# Patient Record
Sex: Male | Born: 1985 | Race: Black or African American | Hispanic: No | Marital: Single | Smoking: Never smoker
Health system: Southern US, Community
[De-identification: ages and names within clinical notes are randomized; demographics above are authoritative.]

---

## 2015-02-23 ENCOUNTER — Emergency Department (HOSPITAL_BASED_OUTPATIENT_CLINIC_OR_DEPARTMENT_OTHER)
Admission: EM | Admit: 2015-02-23 | Discharge: 2015-02-23 | Disposition: A | Discharge status: Home / Self Care | Payer: Self-pay | Attending: Emergency Medicine | Admitting: Emergency Medicine

## 2015-02-23 ENCOUNTER — Encounter (HOSPITAL_BASED_OUTPATIENT_CLINIC_OR_DEPARTMENT_OTHER): Payer: Self-pay | Admitting: *Deleted

## 2015-02-23 ENCOUNTER — Emergency Department (HOSPITAL_BASED_OUTPATIENT_CLINIC_OR_DEPARTMENT_OTHER): Payer: Self-pay

## 2015-02-23 DIAGNOSIS — S43102A Unspecified dislocation of left acromioclavicular joint, initial encounter: Secondary | ICD-10-CM | POA: Insufficient documentation

## 2015-02-23 DIAGNOSIS — S42032A Displaced fracture of lateral end of left clavicle, initial encounter for closed fracture: Secondary | ICD-10-CM | POA: Insufficient documentation

## 2015-02-23 DIAGNOSIS — S43005A Unspecified dislocation of left shoulder joint, initial encounter: Secondary | ICD-10-CM

## 2015-02-23 DIAGNOSIS — Y998 Other external cause status: Secondary | ICD-10-CM | POA: Insufficient documentation

## 2015-02-23 DIAGNOSIS — S42002A Fracture of unspecified part of left clavicle, initial encounter for closed fracture: Secondary | ICD-10-CM

## 2015-02-23 DIAGNOSIS — Y92321 Football field as the place of occurrence of the external cause: Secondary | ICD-10-CM | POA: Insufficient documentation

## 2015-02-23 DIAGNOSIS — Y9361 Activity, american tackle football: Secondary | ICD-10-CM | POA: Insufficient documentation

## 2015-02-23 DIAGNOSIS — W1839XA Other fall on same level, initial encounter: Secondary | ICD-10-CM | POA: Insufficient documentation

## 2015-02-23 MED ORDER — OXYCODONE-ACETAMINOPHEN 5-325 MG PO TABS: 1.0000 | ORAL_TABLET | Freq: Four times a day (QID) | ORAL | Status: AC | PRN

## 2015-02-23 MED ORDER — IBUPROFEN 600 MG PO TABS: 600.0000 mg | ORAL_TABLET | Freq: Four times a day (QID) | ORAL | Status: AC | PRN

## 2015-02-23 MED ORDER — OXYCODONE-ACETAMINOPHEN 5-325 MG PO TABS
1.0000 | ORAL_TABLET | Freq: Once | ORAL | Status: AC
  Administered 2015-02-23: 1 via ORAL
  Filled 2015-02-23: qty 1

## 2015-02-23 NOTE — ED Notes (Signed)
States he broke left scapula on January 18 States he had a sling x 1.5 months. States he was playing football today and rolled onto left shoulder and reinjured and felt something pop in left shoulder. Obvious deformity to left shoulder.

## 2015-02-23 NOTE — Discharge Instructions (Signed)
YOu have a shoulder separation with a clavicular avulsion fracture.  You should keep your arm in a sling until you follow up with orthopedics.  Acromioclavicular Injuries The AC (acromioclavicular) joint is the joint in the shoulder where the collarbone (clavicle) meets the shoulder blade (scapula). The part of the shoulder blade connected to the collarbone is called the acromion. Common problems with and treatments for the [facility] are detailed below. ARTHRITIS Arthritis occurs when the joint has been injured and the smooth padding between the joints (cartilage) is lost. This is the wear and tear seen in most joints of the body if they have been overused. This causes the joint to produce pain and swelling which is worse with activity.  AC JOINT SEPARATION AC joint separation means that the ligaments connecting the acromion of the shoulder blade and collarbone have been damaged, and the two bones no longer line up. AC separations can be anywhere from mild to severe, and are "graded" depending upon which ligaments are torn and how badly they are torn.  Grade I Injury: the least damage is done, and the [facility] joint still lines up.  Grade II Injury: damage to the ligaments which reinforce the [facility] joint. In a Grade II injury, these ligaments are stretched but not entirely torn. When stressed, the [facility] joint becomes painful and unstable.  Grade III Injury: AC and secondary ligaments are completely torn, and the collarbone is no longer attached to the shoulder blade. This results in deformity; a prominence of the end of the clavicle. AC JOINT FRACTURE AC joint fracture means that there has been a break in the bones of the [facility] joint, usually the end of the clavicle. TREATMENT TREATMENT OF AC ARTHRITIS  There is currently no way to replace the cartilage damaged by arthritis. The best way to improve the condition is to decrease the activities which aggravate the problem. Application of ice to the joint helps  decrease pain and soreness (inflammation). The use of non-steroidal anti-inflammatory medication is helpful.  If less conservative measures do not work, then cortisone shots (injections) may be used. These are anti-inflammatories; they decrease the soreness in the joint and swelling.  If non-surgical measures fail, surgery may be recommended. The procedure is generally removal of a portion of the end of the clavicle. This is the part of the collarbone closest to your acromion which is stabilized with ligaments to the acromion of the shoulder blade. This surgery may be performed using a tube-like instrument with a light (arthroscope) for looking into a joint. It may also be performed as an open surgery through a small incision by the surgeon. Most patients will have good range of motion within 6 weeks and may return to all activity including sports by 8-12 weeks, barring complications. TREATMENT OF AN AC SEPARATION  The initial treatment is to decrease pain. This is best accomplished by immobilizing the arm in a sling and placing an ice pack to the shoulder for 20 to 30 minutes every 2 hours as needed. As the pain starts to subside, it is important to begin moving the fingers, wrist, elbow and eventually the shoulder in order to prevent a stiff or "frozen" shoulder. Instruction on when and how much to move the shoulder will be provided by your caregiver. The length of time needed to regain full motion and function depends on the amount or grade of the injury. Recovery from a Grade I AC separation usually takes 10 to 14 days, whereas a Grade III  may take 6 to 8 weeks.  Grade I and II separations usually do not require surgery. Even Grade III injuries usually allow return to full activity with few restrictions. Treatment is also based on the activity demands of the injured shoulder. For example, a high level quarterback with an injured throwing arm will receive more aggressive treatment than someone with a  desk job who rarely uses his/her arm for strenuous activities. In some cases, a painful lump may persist which could require a later surgery. Surgery can be very successful, but the benefits must be weighed against the potential risks. TREATMENT OF AN AC JOINT FRACTURE Fracture treatment depends on the type of fracture. Sometimes a splint or sling may be all that is required. Other times surgery may be required for repair. This is more frequently the case when the ligaments supporting the clavicle are completely torn. Your caregiver will help you with these decisions and together you can decide what will be the best treatment. HOME CARE INSTRUCTIONS   Apply ice to the injury for 15-20 minutes each hour while awake for 2 days. Put the ice in a plastic bag and place a towel between the bag of ice and skin.  If a sling has been applied, wear it constantly for as long as directed by your caregiver, even at night. The sling or splint can be removed for bathing or showering or as directed. Be sure to keep the shoulder in the same place as when the sling is on. Do not lift the arm.  If a figure-of-eight splint has been applied it should be tightened gently by another person every day. Tighten it enough to keep the shoulders held back. Allow enough room to place the index finger between the body and strap. Loosen the splint immediately if there is numbness or tingling in the hands.  Take over-the-counter or prescription medicines for pain, discomfort or fever as directed by your caregiver.  If you or your child has received a follow up appointment, it is very important to keep that appointment in order to avoid long term complications, chronic pain or disability. SEEK MEDICAL CARE IF:   The pain is not relieved with medications.  There is increased swelling or discoloration that continues to get worse rather than better.  You or your child has been unable to follow up as instructed.  There is  progressive numbness and tingling in the arm, forearm or hand. SEEK IMMEDIATE MEDICAL CARE IF:   The arm is numb, cold or pale.  There is increasing pain in the hand, forearm or fingers. MAKE SURE YOU:   Understand these instructions.  Will watch your condition.  Will get help right away if you are not doing well or get worse. Document Released: 09/02/2005 Document Revised: 02/15/2012 Document Reviewed: 02/25/2009 [facility] Patient Information 2015 [place], [place]. This information is not intended to replace advice given to you by your health care provider. Make sure you discuss any questions you have with your health care provider.

## 2015-02-23 NOTE — ED Provider Notes (Signed)
CSN: [id number]     Arrival date & time 02/23/15  1017 History   First MD Initiated Contact with Patient 02/23/15 1023     Chief Complaint  Patient presents with  . Shoulder Injury     (Consider location/radiation/quality/duration/timing/severity/associated sxs/prior Treatment) HPI   Patient presents with left shoulder pain.  Reports with he fell onto his left shoulder during a football game earlier today.  Recent history of scapula fracture.  Reports 10/10.  INjury occurred just PTA.  Hasn't taken anything for the pain.  Denies other injury.  Denies weakness, numbness or tingling distally.  History reviewed. No pertinent past medical history. History reviewed. No pertinent past surgical history. No family history on file. History  Substance Use Topics  . Smoking status: Never Smoker   . Smokeless tobacco: Not on file  . Alcohol Use: Not on file    Review of Systems  Musculoskeletal:       Left shoulder pain  Skin: Negative for wound.  Neurological: Negative for weakness and numbness.  All other systems reviewed and are negative.     Allergies  Review of patient's allergies indicates no known allergies.  Home Medications   Prior to Admission medications   Medication Sig Start Date End Date Taking? Authorizing Provider  ibuprofen (ADVIL,MOTRIN) 600 MG tablet Take 1 tablet (600 mg total) by mouth every 6 (six) hours as needed. 02/23/15   Shon Batonourtney F Emalie Mcwethy, MD  oxyCODONE-acetaminophen (PERCOCET/ROXICET) 5-325 MG per tablet Take 1 tablet by mouth every 6 (six) hours as needed for severe pain. 02/23/15   Shon Batonourtney F Tanae Petrosky, MD   BP 141/89 mmHg  Pulse 73  Temp(Src) 98.8 F (37.1 C) (Oral)  Resp 18  Ht 5\' 9"  (1.753 m)  Wt 160 lb (72.576 kg)  BMI 23.62 kg/m2  SpO2 97% Physical Exam  Constitutional: He is oriented to person, place, and time. He appears well-developed and well-nourished. No distress.  HENT:  Head: Normocephalic and atraumatic.  Cardiovascular: Normal rate  and regular rhythm.   Pulmonary/Chest: Effort normal. No respiratory distress.  Musculoskeletal:  TTP at the distal clavicle and AC joint of the left shoulder, no obvious deformitiy, minimal swelling, overlying superficial abrasion noted, 2+ radial pulse, neurovascularly intact distally  Neurological: He is alert and oriented to person, place, and time.  Skin: Skin is warm and dry.  Psychiatric: He has a normal mood and affect.  Nursing note and vitals reviewed.   ED Course  Procedures (including critical care time) Labs Review Labs Reviewed - No data to display  Imaging Review Dg Shoulder Left  02/23/2015   CLINICAL DATA:  Football injury with shoulder pain, initial encounter  EXAM: LEFT SHOULDER - 2+ VIEW  COMPARISON:  None.  FINDINGS: There are a few small bony densities noted beneath the distal aspect of the left clavicle. These may represent small avulsion fractures from the clavicle. No other focal abnormality is seen. Mild separation of the acromioclavicular joint is seen. Small left cervical rib is noted.  IMPRESSION: Changes suspicious for small avulsion fractures from the distal clavicle with acromioclavicular separation. Correlation to point tenderness is recommended.   Electronically Signed   By: Alcide CleverMark  Lukens M.D.   On: 02/23/2015 11:09     EKG Interpretation None      MDM   Final diagnoses:  Shoulder separation, left, initial encounter  Clavicle fracture, left, closed, initial encounter   Patient given sling, pain medications and orthopedic follow-up.  After history, exam, and medical workup I feel  the patient has been appropriately medically screened and is safe for discharge home. Pertinent diagnoses were discussed with the patient. Patient was given return precautions.     Shon Baton, MD 02/24/15 1314

## 2015-12-22 IMAGING — DX DG SHOULDER 2+V*L*
3 series · 3 of 3 positions shown · non-contrast
Comparison: None.

CLINICAL DATA: Football injury with shoulder pain, initial
encounter

EXAM:
LEFT SHOULDER - 2+ VIEW

[shoulder grashey]
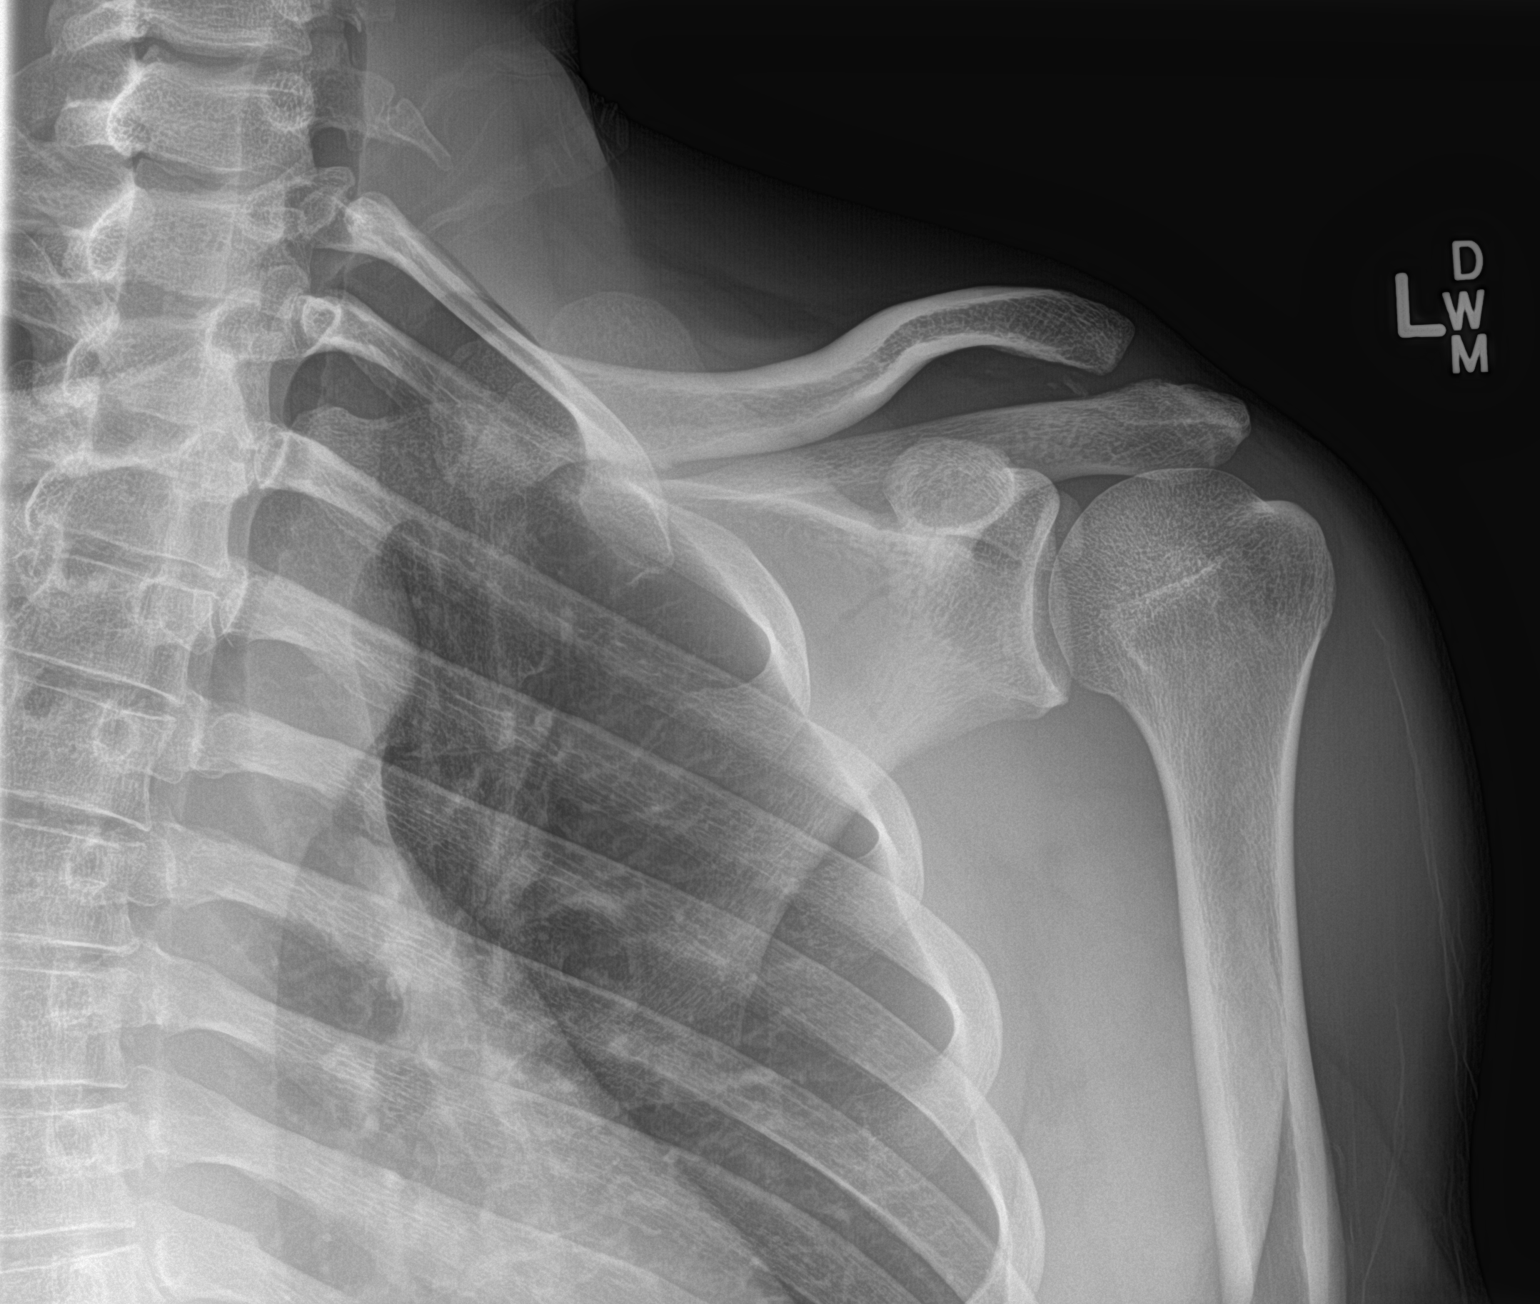

[shoulder y view]
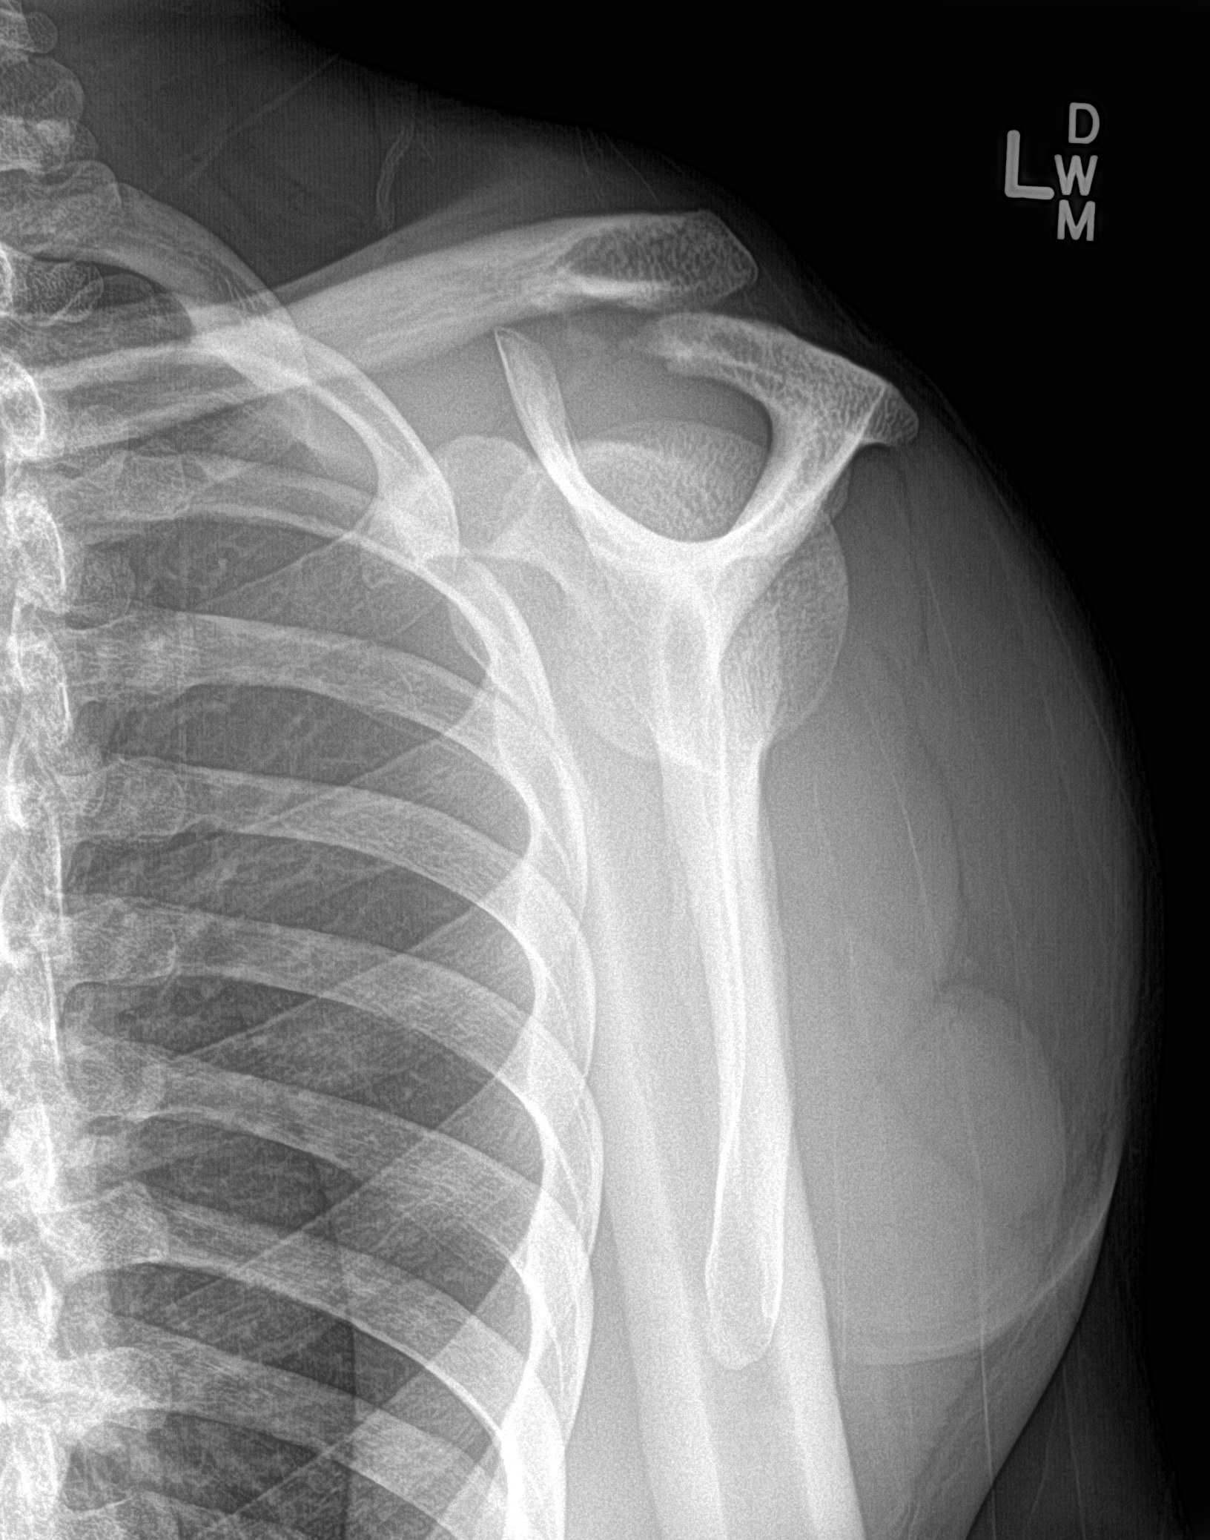

[shoulder axillary]
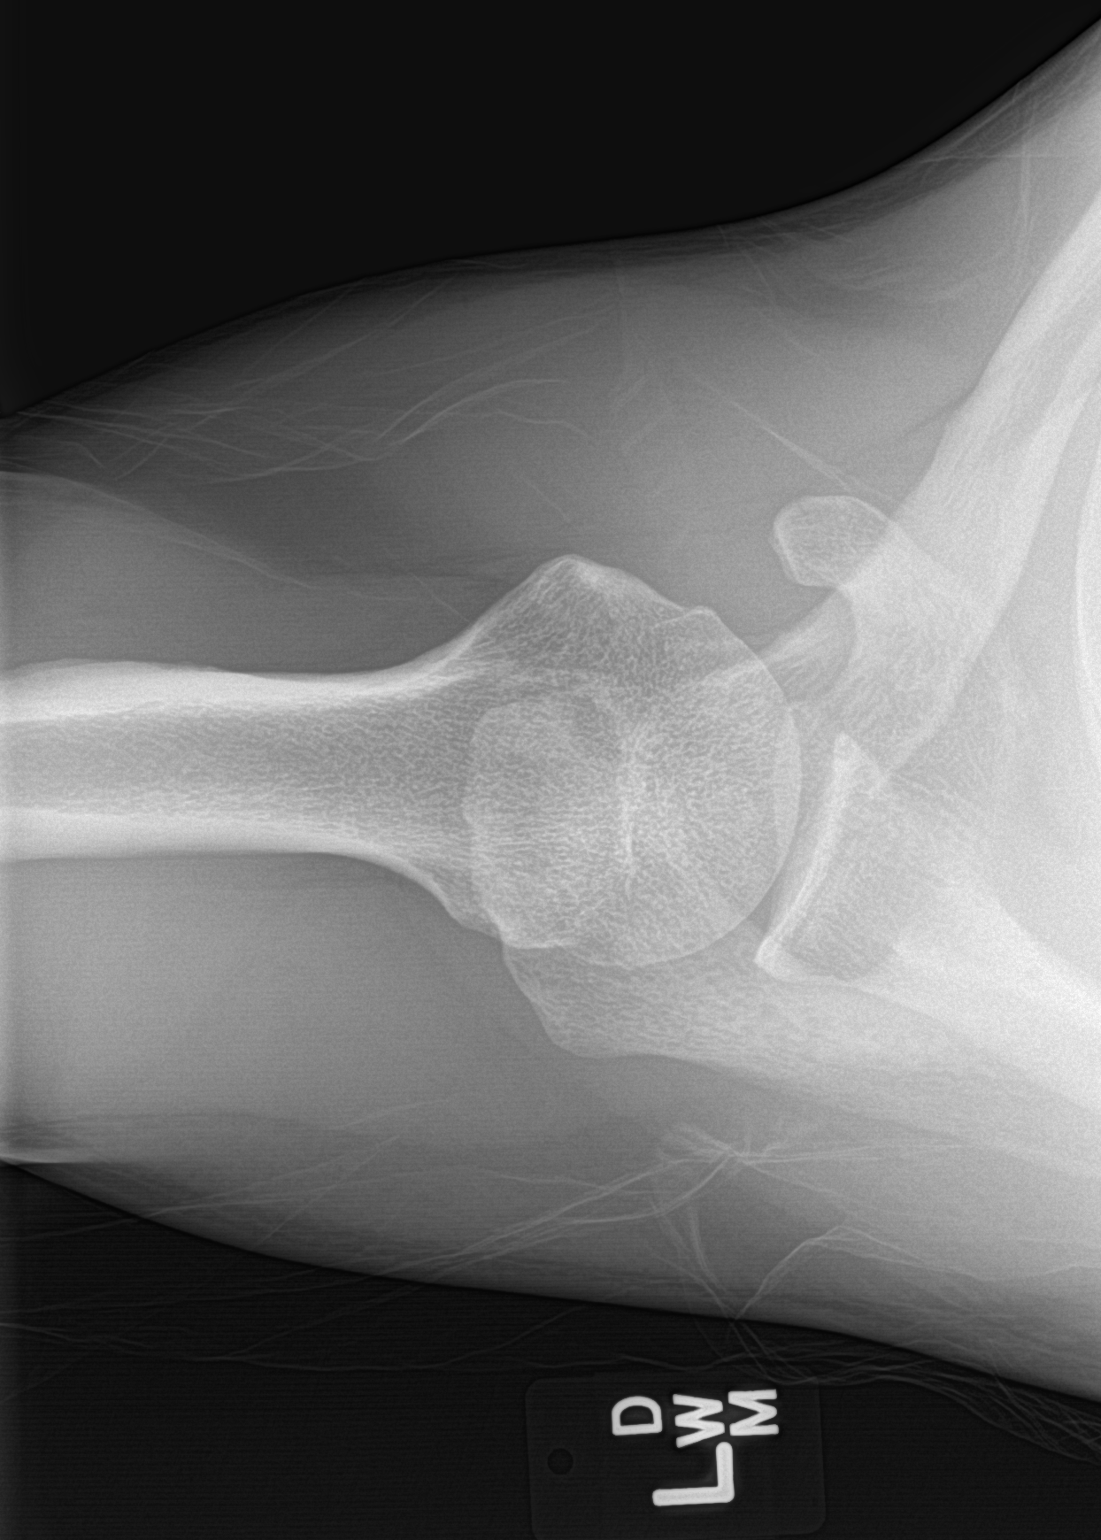

[3 of 3 positions shown; findings below may reference images not displayed]

FINDINGS: There are a few small bony densities noted beneath the distal aspect
of the left clavicle. These may represent small avulsion fractures
from the clavicle. No other focal abnormality is seen. Mild
separation of the acromioclavicular joint is seen. Small left
cervical rib is noted.
IMPRESSION: Changes suspicious for small avulsion fractures from the distal
clavicle with acromioclavicular separation. Correlation to point
tenderness is recommended.
